# Patient Record
Sex: Male | Born: 1970 | Race: White | Hispanic: No | Marital: Married | Smoking: Never smoker
Health system: Southern US, Community
[De-identification: ages and names within clinical notes are randomized; demographics above are authoritative.]

## PROBLEM LIST (undated history)

## (undated) DIAGNOSIS — I10 Essential (primary) hypertension: Secondary | ICD-10-CM

## (undated) DIAGNOSIS — K219 Gastro-esophageal reflux disease without esophagitis: Secondary | ICD-10-CM

## (undated) HISTORY — PX: HERNIA REPAIR: SHX51

## (undated) HISTORY — DX: Essential (primary) hypertension: I10

---

## 1999-11-13 ENCOUNTER — Encounter: Admission: RE | Admit: 1999-11-13 | Discharge: 2000-02-11 | Payer: Self-pay | Admitting: *Deleted

## 2000-12-11 ENCOUNTER — Encounter: Payer: Self-pay | Admitting: Internal Medicine

## 2000-12-11 ENCOUNTER — Encounter: Admission: RE | Admit: 2000-12-11 | Discharge: 2000-12-11 | Payer: Self-pay | Admitting: Internal Medicine

## 2004-11-23 ENCOUNTER — Emergency Department (HOSPITAL_COMMUNITY): Admission: EM | Admit: 2004-11-23 | Discharge: 2004-11-23 | Payer: Self-pay | Admitting: Emergency Medicine

## 2006-06-28 ENCOUNTER — Encounter: Admission: RE | Admit: 2006-06-28 | Discharge: 2006-06-28 | Payer: Self-pay | Admitting: Orthopaedic Surgery

## 2010-04-19 ENCOUNTER — Other Ambulatory Visit (HOSPITAL_COMMUNITY): Payer: BC Managed Care – PPO

## 2010-04-24 ENCOUNTER — Ambulatory Visit (HOSPITAL_COMMUNITY): Admission: RE | Admit: 2010-04-24 | Payer: BC Managed Care – PPO | Source: Ambulatory Visit | Admitting: General Surgery

## 2010-04-24 ENCOUNTER — Ambulatory Visit
Admission: RE | Admit: 2010-04-24 | Discharge: 2010-04-24 | Disposition: A | Discharge status: Home / Self Care | Payer: BC Managed Care – PPO | Source: Ambulatory Visit | Attending: General Surgery | Admitting: General Surgery

## 2010-04-24 ENCOUNTER — Other Ambulatory Visit: Payer: Self-pay | Admitting: General Surgery

## 2010-04-24 DIAGNOSIS — Z01811 Encounter for preprocedural respiratory examination: Secondary | ICD-10-CM

## 2014-08-30 ENCOUNTER — Other Ambulatory Visit: Payer: Self-pay | Admitting: Internal Medicine

## 2014-08-30 ENCOUNTER — Ambulatory Visit
Admission: RE | Admit: 2014-08-30 | Discharge: 2014-08-30 | Disposition: A | Discharge status: Home / Self Care | Payer: BLUE CROSS/BLUE SHIELD | Source: Ambulatory Visit | Attending: Internal Medicine | Admitting: Internal Medicine

## 2014-08-30 DIAGNOSIS — R05 Cough: Secondary | ICD-10-CM

## 2014-08-30 DIAGNOSIS — R059 Cough, unspecified: Secondary | ICD-10-CM

## 2014-10-25 ENCOUNTER — Ambulatory Visit (INDEPENDENT_AMBULATORY_CARE_PROVIDER_SITE_OTHER): Payer: BLUE CROSS/BLUE SHIELD | Admitting: Internal Medicine

## 2014-10-25 ENCOUNTER — Encounter: Payer: Self-pay | Admitting: Internal Medicine

## 2014-10-25 VITALS — BP 104/70 | HR 92 | Ht 69.0 in | Wt 203.0 lb

## 2014-10-25 DIAGNOSIS — I10 Essential (primary) hypertension: Secondary | ICD-10-CM | POA: Diagnosis not present

## 2014-10-25 DIAGNOSIS — R05 Cough: Secondary | ICD-10-CM

## 2014-10-25 DIAGNOSIS — R058 Other specified cough: Secondary | ICD-10-CM

## 2014-10-25 NOTE — Progress Notes (Signed)
Subjective:    Patient ID: Lee Gilmore, male    DOB: 1971-01-26,    MRN: [id number]  HPI  44 year old white male never smoker and no previous history of any allergy or respiratory issues until April 2016 with the onset of a persistent dry cough referred courtesy of Dr. Eula Listen to the pulmonary clinic 10/26/2014    10/26/2014 1st [facility] office visit/ Lee Gilmore   Chief Complaint  Patient presents with  . Pulmonary Consult    Referred by Dr. Eula Listen. Pt c/o cough x 4 months- non prod and with no specific trigger. He also c/o occ trouble with taking a good, deep breath and sometimes gets dizzy.    onset was abrupt with what he thought was just a typical cold and although all the cold symptoms resolvef the cough did not. He describes it as mostly dry and mostly daytime, rarely disturbing his sleep. It can be so severe he coughs to the point of gagging. However, he denies sign dyspnea and less he is having severe cough fits.  Already tried otc's for cold/cough suppression/ and gerd s benefit.    No obvious other patterns in day to day or daytime variabilty or assoc chronic cough or cp or chest tightness, subjective wheeze overt sinus or hb symptoms. No unusual exp hx or h/o childhood pna/ asthma or knowledge of premature birth.  Sleeping ok without nocturnal  or early am exacerbation  of respiratory  c/o's or need for noct saba. Also denies any obvious fluctuation of symptoms with weather or environmental changes or other aggravating or alleviating factors except as outlined above   Current Medications, Allergies, Complete Past Medical History, Past Surgical History, Family History, and Social History were reviewed in [employer] record.           Review of Systems  Constitutional: Negative for fever, chills, activity change, appetite change and unexpected weight change.  HENT: Negative for congestion, dental problem, postnasal drip, rhinorrhea, sneezing, sore  throat, trouble swallowing and voice change.   Eyes: Negative for visual disturbance.  Respiratory: Positive for cough and shortness of breath. Negative for choking.   Cardiovascular: Negative for chest pain and leg swelling.  Gastrointestinal: Negative for nausea, vomiting and abdominal pain.  Genitourinary: Negative for difficulty urinating.  Musculoskeletal: Negative for arthralgias.  Skin: Negative for rash.  Psychiatric/Behavioral: Negative for behavioral problems and confusion.       Objective:   Physical Exam  amb mod obese pleasant wm with mild voice fatigue  Wt Readings from Last 3 Encounters:  10/25/14 203 lb (92.08 kg)    Vital signs reviewed  HEENT: nl dentition, turbinates, and orophanx. Nl external ear canals without cough reflex   NECK :  without JVD/Nodes/TM/ nl carotid upstrokes bilaterally   LUNGS: no acc muscle use, clear to A and P bilaterally without cough on insp or exp maneuvers   CV:  RRR  no s3 or murmur or increase in P2, no edema   ABD:  soft and nontender with nl excursion in the supine position. No bruits or organomegaly, bowel sounds nl  MS:  warm without deformities, calf tenderness, cyanosis or clubbing  SKIN: warm and dry without lesions    NEURO:  alert, approp, no deficits     I personally reviewed images and agree with radiology impression as follows:  CXR:  08/30/14 Slight hyperinflation with minimal subsegmental atelectasis at LEFT base.         Assessment & Plan:

## 2014-10-26 ENCOUNTER — Encounter: Payer: Self-pay | Admitting: Internal Medicine

## 2014-10-26 DIAGNOSIS — R05 Cough: Secondary | ICD-10-CM | POA: Insufficient documentation

## 2014-10-26 DIAGNOSIS — I10 Essential (primary) hypertension: Secondary | ICD-10-CM | POA: Insufficient documentation

## 2014-10-26 DIAGNOSIS — R058 Other specified cough: Secondary | ICD-10-CM | POA: Insufficient documentation

## 2014-10-26 NOTE — Patient Instructions (Addendum)
The key to effective treatment for your cough is eliminating the non-stop cycle of cough you're stuck in long enough to let your airway heal completely and then see if there is anything still making you cough once you stop the cough suppression, but this should take no more than 5 days to figure out  First take delsym two tsp every 12 hours and supplement if needed with  tramadol 50 mg up to 2 every 4 hours to suppress the urge to cough at all or even clear your throat. Swallowing water or using ice chips/non mint and menthol containing candies (such as lifesavers or sugarless jolly ranchers) are also effective.  You should rest your voice and avoid activities that you know make you cough.  Once you have eliminated the cough for 3 straight days try reducing the tramadol first,  then the delsym as tolerated.    Prednisone 10 mg take  4 each am x 2 days,   2 each am x 2 days,  1 each am x 2 days and stop (this is to eliminate allergies and inflammation from coughing)  Protonix (pantoprazole) Take 30-60 min before first meal of the day and Pepcid 20 mg one bedtime  until cough is completely gone for at least a week without the need for cough suppression  GERD (REFLUX)  is an extremely common cause of respiratory symptoms, many times with no significant heartburn at all.    It can be treated with medication, but also with lifestyle changes including avoidance of late meals, excessive alcohol, smoking cessation, and avoid fatty foods, chocolate, peppermint, colas, red wine, and acidic juices such as orange juice.  NO MINT OR MENTHOL PRODUCTS SO NO COUGH DROPS  USE HARD CANDY INSTEAD (jolley ranchers or Stover's or Lifesavers (all available in sugarless versions) NO OIL BASED VITAMINS - use powdered substitutes.  Follow up is as needed/ consider trial off losartan next

## 2014-10-26 NOTE — Assessment & Plan Note (Signed)
The most common causes of chronic cough in immunocompetent adults include the following: upper airway cough syndrome (UACS), previously referred to as postnasal drip syndrome (PNDS), which is caused by variety of rhinosinus conditions; (2) asthma; (3) GERD; (4) chronic bronchitis from cigarette smoking or other inhaled environmental irritants; (5) nonasthmatic eosinophilic bronchitis; and (6) bronchiectasis.   These conditions, singly or in combination, have accounted for up to 94% of the causes of chronic cough in prospective studies.   Other conditions have constituted no >6% of the causes in prospective studies These have included bronchogenic carcinoma, chronic interstitial pneumonia, sarcoidosis, left ventricular failure, ACEI-induced cough, and aspiration from a condition associated with pharyngeal dysfunction.    Chronic cough is often simultaneously caused by more than one condition. A single cause has been found from 38 to 82% of the time, multiple causes from 18 to 62%. Multiply caused cough has been the result of three diseases up to 42% of the time.       Based on hx and exam, this is most likely:  Classic Upper airway cough syndrome, so named because it's frequently impossible to sort out how much is  CR/sinusitis with freq throat clearing (which can be related to primary GERD)   vs  causing  secondary (" extra esophageal")  GERD from wide swings in gastric pressure that occur with throat clearing, often  promoting self use of mint and menthol lozenges that reduce the lower esophageal sphincter tone and exacerbate the problem further in a cyclical fashion.   These are the same pts (now being labeled as having "irritable larynx syndrome" by some cough centers) who not infrequently have a history of having failed to tolerate ace inhibitors,  dry powder inhalers or biphosphonates or report having atypical reflux symptoms that don't respond to standard doses of PPI , and are easily confused as  having aecopd or asthma flares by even experienced allergists/ pulmonologists.   The first step is to maximize acid suppression and eliminate cyclical coughing then regroup if the cough persists.  I had an extended discussion with the patient and wife reviewing all relevant studies completed to date and  lasting 35 min  1) Explained: The standardized cough guidelines published in Chest by [place] in 2006 are still the best available and consist of a multiple step process (up to 12!) , not a single office visit,  and are intended  to address this problem logically,  with an alogrithm dependent on response to empiric treatment at  each progressive step  to determine a specific diagnosis with  minimal addtional testing needed. Therefore if adherence is an issue or can't be accurately verified,  it's very unlikely the standard evaluation and treatment will be successful here.    Furthermore, response to therapy (other than acute cough suppression, which should only be used short term with avoidance of narcotic containing cough syrups if possible), can be a gradual process for which the patient may perceive immediate benefit.  Unlike going to an eye doctor where the best perscription is almost always the first one and is immediately effective, this is almost never the case in the management of chronic cough syndromes. Therefore the patient needs to commit up front to consistently adhere to recommendations  for up to 6 weeks of therapy directed at the likely underlying problem(s) before the response can be reasonably evaluated.     2) Each maintenance medication was reviewed in detail including most importantly the difference between maintenance and prns and  under what circumstances the prns are to be triggered using an action plan format that is not reflected in the computer generated alphabetically organized AVS.    Please see instructions for details which were reviewed in writing and the patient  given a copy highlighting the part that I personally wrote and discussed at today's ov.   See instructions for specific recommendations which were reviewed directly with the patient who was given a copy with highlighter outlining the key components.

## 2014-10-26 NOTE — Assessment & Plan Note (Signed)
For reasons that may related to vascular permability and nitric oxide pathways but not elevated  bradykinin levels (as seen with  ACEi use) losartan in the generic form has been reported now from mulitple sources  to cause a similar pattern of non-specific  upper airway symptoms as seen with acei.   This has not been reported with exposure to the other ARB's to date, so if cough persists then I would try either generic diovan or avapro if ARB needed or use an alternative class altogether.  See:  [facility] All[facility] Asthma Immunol  2008: 101: p 495-499

## 2014-11-15 ENCOUNTER — Telehealth: Payer: Self-pay | Admitting: Internal Medicine

## 2014-11-15 DIAGNOSIS — I10 Essential (primary) hypertension: Secondary | ICD-10-CM

## 2014-11-15 MED ORDER — VALSARTAN 80 MG PO TABS: 80.0000 mg | ORAL_TABLET | Freq: Every day | ORAL | Status: DC

## 2014-11-15 MED ORDER — ACETAMINOPHEN-CODEINE #3 300-30 MG PO TABS: ORAL_TABLET | ORAL | Status: AC

## 2014-11-15 NOTE — Telephone Encounter (Signed)
Patient's wife calling.  She says that patient took prednisone, Delsym, Nexium, Pepcid.  Patient took the Ultram and it made him "drunk" and he cannot take it.  Cough is some better, but still coughing a lot. Still taking Nexium and Pepcid.Lee Gilmore with Prednisone.  His chest feels :"irritated" from the cough since he is coughing so hard.     No Known Allergies

## 2014-11-15 NOTE — Telephone Encounter (Signed)
Stop cozar  Start diovan 80 mg one daily  All cough meds stronger than delsym can cause wooziness so best option is tylenol # 3 one half every 4 hours and if tolerates and still coughing then a whole every 4 hours to replace the ultram in his instructions: give #40 and make sure he has ov by the end of next week

## 2014-11-15 NOTE — Telephone Encounter (Signed)
Patients wife notified. Will call back to schedule appointment, patient is currently out of town Rx sent to pharmacy. Nothing further needed.

## 2014-11-27 ENCOUNTER — Ambulatory Visit (INDEPENDENT_AMBULATORY_CARE_PROVIDER_SITE_OTHER)
Admission: RE | Admit: 2014-11-27 | Discharge: 2014-11-27 | Disposition: A | Discharge status: Home / Self Care | Payer: BLUE CROSS/BLUE SHIELD | Source: Ambulatory Visit | Attending: Internal Medicine | Admitting: Internal Medicine

## 2014-11-27 ENCOUNTER — Ambulatory Visit (INDEPENDENT_AMBULATORY_CARE_PROVIDER_SITE_OTHER): Payer: BLUE CROSS/BLUE SHIELD | Admitting: Internal Medicine

## 2014-11-27 ENCOUNTER — Encounter: Payer: Self-pay | Admitting: Internal Medicine

## 2014-11-27 ENCOUNTER — Other Ambulatory Visit (INDEPENDENT_AMBULATORY_CARE_PROVIDER_SITE_OTHER): Payer: BLUE CROSS/BLUE SHIELD

## 2014-11-27 VITALS — BP 138/86 | HR 78 | Ht 70.0 in | Wt 205.0 lb

## 2014-11-27 DIAGNOSIS — R05 Cough: Secondary | ICD-10-CM | POA: Diagnosis not present

## 2014-11-27 DIAGNOSIS — R058 Other specified cough: Secondary | ICD-10-CM

## 2014-11-27 DIAGNOSIS — I10 Essential (primary) hypertension: Secondary | ICD-10-CM

## 2014-11-27 LAB — CBC WITH DIFFERENTIAL/PLATELET
BASOS ABS: 0 10*3/uL (ref 0.0–0.1)
Basophils Relative: 0.7 % (ref 0.0–3.0)
EOS ABS: 0 10*3/uL (ref 0.0–0.7)
Eosinophils Relative: 0.6 % (ref 0.0–5.0)
HCT: 47.7 % (ref 39.0–52.0)
Hemoglobin: 16.2 g/dL (ref 13.0–17.0)
LYMPHS PCT: 23.9 % (ref 12.0–46.0)
Lymphs Abs: 1.6 10*3/uL (ref 0.7–4.0)
MCHC: 34 g/dL (ref 30.0–36.0)
MCV: 87.3 fl (ref 78.0–100.0)
Monocytes Absolute: 0.4 10*3/uL (ref 0.1–1.0)
Monocytes Relative: 6.4 % (ref 3.0–12.0)
NEUTROS ABS: 4.5 10*3/uL (ref 1.4–7.7)
Neutrophils Relative %: 68.4 % (ref 43.0–77.0)
Platelets: 250 10*3/uL (ref 150.0–400.0)
RBC: 5.46 Mil/uL (ref 4.22–5.81)
RDW: 13.4 % (ref 11.5–15.5)
WBC: 6.5 10*3/uL (ref 4.0–10.5)

## 2014-11-27 MED ORDER — VALSARTAN-HYDROCHLOROTHIAZIDE 160-12.5 MG PO TABS: 1.0000 | ORAL_TABLET | Freq: Every day | ORAL | Status: DC

## 2014-11-27 NOTE — Patient Instructions (Addendum)
The key to effective treatment for your cough is eliminating the non-stop cycle of cough you're stuck in long enough to let your airway heal completely and then see if there is anything still making you cough once you stop the cough suppression, but this should take no more than 5 days to figure out  First take delsym two tsp every 12 hours and supplement if needed with tylenol #3  up to 2 every 4 hours to suppress the urge to cough at all or even clear your throat. Swallowing water or using ice chips/non mint and menthol containing candies (such as lifesavers or sugarless jolly ranchers) are also effective.  You should rest your voice and avoid activities that you know make you cough.  Once you have eliminated the cough for 3 straight days try reducing the tylenol #3  first,  then the delsym as tolerated.     Protonix (pantoprazole) Take 30-60 min before first meal of the day and Pepcid 20 mg one bedtime  until cough is completely gone for at least a week without the need for cough suppression  For drainage / throat tickle try take CHLORPHENIRAMINE  4 mg - take one every 4 hours as needed - available over the counter- may cause drowsiness so start with just a bedtime dose or two and see how you tolerate it before trying in daytime    Take another diovan asap and 2 daily until gone then start diovan 160-12.5 one daily   Please remember to go to the lab and x-ray department downstairs for your tests - we will call you with the results when they are available.     Please schedule a follow up office visit in 2 weeks, sooner if needed  - ok to cancel if doing great

## 2014-11-27 NOTE — Progress Notes (Signed)
Subjective:    Patient ID: Lee Gilmore, male    DOB: [birth year]    MRN: [id number]   Brief patient profile:  44 year old white male never smoker and no previous history of any allergy or respiratory issues until April 2016 with the onset of a persistent dry cough referred courtesy of Dr. Eula Listen to the pulmonary clinic 10/26/2014    10/26/2014 1st [facility] office visit/ Wert   Chief Complaint  Patient presents with  . Pulmonary Consult    Referred by Dr. Eula Listen. Pt c/o cough x 4 months- non prod and with no specific trigger. He also c/o occ trouble with taking a good, deep breath and sometimes gets dizzy.    onset was abrupt with what he thought was just a typical cold and although all the cold symptoms resolvef the cough did not. He describes it as mostly dry and mostly daytime, rarely disturbing his sleep. It can be so severe he coughs to the point of gagging. However, he denies sign dyspnea and less he is having severe cough fits.  Already tried otc's for cold/cough suppression/ and gerd s benefit.  Rec  First take delsym two tsp every 12 hours and supplement if needed with  tramadol 50 mg up to 2 every 4 hours to suppress the urge to cough at all or even clear your throat. Swallowing water or using ice chips/non mint and menthol containing candies (such as lifesavers or sugarless jolly ranchers) are also effective.  You should rest your voice and avoid activities that you know make you cough. Once you have eliminated the cough for 3 straight days try reducing the tramadol first,  then the delsym as tolerated.   Prednisone 10 mg take  4 each am x 2 days,   2 each am x 2 days,  1 each am x 2 days and stop (this is to eliminate allergies and inflammation from coughing) Protonix (pantoprazole) Take 30-60 min before first meal of the day and Pepcid 20 mg one bedtime  until cough is completely gone for at least a week without the need for cough suppression GERD  Diet  Follow up is as  needed/   trial off losartan next > 11/20/14   11/27/2014  F/u [facility] office visit/ Wert  Re cough x April  2016  Chief Complaint  Patient presents with  . Follow-up    Still havig some cough but not as bad. still dry.   cough is mostly daytime mostly dry no longer gagging and overall 50% better though could not tol tramadol, has taken codeine before s trouble   No obvious day to day or daytime variability or assoc sob  or cp or chest tightness, subjective wheeze or overt sinus or hb symptoms. No unusual exp hx or h/o childhood pna/ asthma or knowledge of premature birth.  Sleeping ok without nocturnal  or early am exacerbation  of respiratory  c/o's or need for noct saba. Also denies any obvious fluctuation of symptoms with weather or environmental changes or other aggravating or alleviating factors except as outlined above   Current Medications, Allergies, Complete Past Medical History, Past Surgical History, Family History, and Social History were reviewed in [employer] record.  ROS  The following are not active complaints unless bolded sore throat, dysphagia, dental problems, itching, sneezing,  nasal congestion or excess/ purulent secretions, ear ache,   fever, chills, sweats, unintended wt loss, classically pleuritic or exertional cp, hemoptysis,  orthopnea pnd  or leg swelling, presyncope, palpitations, abdominal pain, anorexia, nausea, vomiting, diarrhea  or change in bowel or bladder habits, change in stools or urine, dysuria,hematuria,  rash, arthralgias, visual complaints, headache, numbness, weakness or ataxia or problems with walking or coordination,  change in mood/affect or memory.                           Objective:   Physical Exam  amb mod obese pleasant wm      Wt Readings from Last 3 Encounters:  11/27/14 205 lb (92.987 kg)  10/25/14 203 lb (92.08 kg)    Vital signs reviewed   HEENT: nl dentition, turbinates, and orophanx. Nl  external ear canals without cough reflex   NECK :  without JVD/Nodes/TM/ nl carotid upstrokes bilaterally   LUNGS: no acc muscle use, clear to A and P bilaterally without cough on insp or exp maneuvers   CV:  RRR  no s3 or murmur or increase in P2, no edema   ABD:  soft and nontender with nl excursion in the supine position. No bruits or organomegaly, bowel sounds nl  MS:  warm without deformities, calf tenderness, cyanosis or clubbing  SKIN: warm and dry without lesions    NEURO:  alert, approp, no deficits     I personally reviewed images and agree with radiology impression as follows:  CXR:    11/27/2014 Trachea is midline. Heart size normal. Minimal scarring in the lingula. Calcified granuloma in the right upper lobe. Lungs are somewhat hyperexpanded but otherwise clear. No pleural fluid.  Labs ordered: allergy profile 11/27/2014 see a/p       Assessment & Plan:

## 2014-11-28 LAB — ALLERGY FULL PROFILE
Allergen,Goose feathers, e70: <0.1 kU/L
Alternaria Alternata: <0.1 kU/L
Bahia Grass: <0.1 kU/L
Bermuda Grass: <0.1 kU/L
Box Elder IgE: <0.1 kU/L
Cat Dander: <0.1 kU/L
Common Ragweed: <0.1 kU/L
Curvularia lunata: <0.1 kU/L
Dog Dander: <0.1 kU/L
Elm IgE: <0.1 kU/L
Fescue: <0.1 kU/L
G005 Rye, Perennial: <0.1 kU/L
G009 Red Top: <0.1 kU/L
Goldenrod: <0.1 kU/L
House Dust Hollister: <0.1 kU/L
IGE (IMMUNOGLOBULIN E), SERUM: 22 kU/L (ref <115)
Oak: <0.1 kU/L
Plantain: <0.1 kU/L
Sycamore Tree: <0.1 kU/L
Timothy Grass: <0.1 kU/L

## 2014-11-28 NOTE — Progress Notes (Signed)
Quick Note:  Spoke with pt and notified of results per Dr. Wert. Pt verbalized understanding and denied any questions.  ______ 

## 2014-11-29 ENCOUNTER — Encounter: Payer: Self-pay | Admitting: Internal Medicine

## 2014-11-29 NOTE — Assessment & Plan Note (Addendum)
11/15/2014 changed cozar to diovan due to cough   Not Adequate control on present rx, reviewed > rec take 2 diovan daily until used up then start  diovan 160-12.5 one daily

## 2014-11-29 NOTE — Assessment & Plan Note (Signed)
-   allergy profile 11/27/14   IgE 22, neg RAST,  Eos 0  Still strongly  favor  Classic Upper airway cough syndrome, so named because it's frequently impossible to sort out how much is  CR/sinusitis with freq throat clearing (which can be related to primary GERD)   vs  causing  secondary (" extra esophageal")  GERD from wide swings in gastric pressure that occur with throat clearing, often  promoting self use of mint and menthol lozenges that reduce the lower esophageal sphincter tone and exacerbate the problem further in a cyclical fashion.   These are the same pts (now being labeled as having "irritable larynx syndrome" by some cough centers) who not infrequently have a history of having failed to tolerate ace inhibitors,  dry powder inhalers or biphosphonates or report having atypical reflux symptoms that don't respond to standard doses of PPI , and are easily confused as having aecopd or asthma flares by even experienced allergists/ pulmonologists.   rec max gerd rx/ elimination of cyclical cough then MCT vs gabapentin trial next   I had an extended discussion with the patient reviewing all relevant studies completed to date and  lasting 15 to 20 minutes of a 25 minute visit    Each maintenance medication was reviewed in detail including most importantly the difference between maintenance and prns and under what circumstances the prns are to be triggered using an action plan format that is not reflected in the computer generated alphabetically organized AVS.    Please see instructions for details which were reviewed in writing and the patient given a copy highlighting the part that I personally wrote and discussed at today's ov.

## 2014-12-11 ENCOUNTER — Ambulatory Visit: Payer: BLUE CROSS/BLUE SHIELD | Admitting: Internal Medicine

## 2014-12-15 ENCOUNTER — Telehealth: Payer: Self-pay | Admitting: Internal Medicine

## 2014-12-15 NOTE — Telephone Encounter (Signed)
Spoke with spouse. Cough has improved and has occas cough. appt cancelled. Nothing further needed

## 2014-12-18 ENCOUNTER — Ambulatory Visit: Payer: BLUE CROSS/BLUE SHIELD | Admitting: Internal Medicine

## 2015-04-17 ENCOUNTER — Encounter: Payer: Self-pay | Admitting: Podiatry

## 2015-04-17 ENCOUNTER — Ambulatory Visit (INDEPENDENT_AMBULATORY_CARE_PROVIDER_SITE_OTHER): Payer: BLUE CROSS/BLUE SHIELD

## 2015-04-17 ENCOUNTER — Ambulatory Visit (INDEPENDENT_AMBULATORY_CARE_PROVIDER_SITE_OTHER): Payer: BLUE CROSS/BLUE SHIELD | Admitting: Podiatry

## 2015-04-17 VITALS — BP 125/80 | HR 78 | Resp 12

## 2015-04-17 DIAGNOSIS — M722 Plantar fascial fibromatosis: Secondary | ICD-10-CM

## 2015-04-17 MED ORDER — METHYLPREDNISOLONE 4 MG PO TBPK
ORAL_TABLET | ORAL | Status: DC
Start: 2015-04-17 — End: 2015-05-14

## 2015-04-17 MED ORDER — MELOXICAM 15 MG PO TABS: 15.0000 mg | ORAL_TABLET | Freq: Every day | ORAL | Status: DC

## 2015-04-17 NOTE — Progress Notes (Signed)
   Subjective:    Patient ID: Lee Gilmore, male    DOB: 11-Aug-1970, 45 y.o.   MRN: [id number]  HPI : He presents today with approximately one-month duration of pain to the bilateral heels. He states the morning seemed to be worse. He denies any treatment.   Review of Systems  Musculoskeletal: Positive for gait problem.       Objective:   Physical Exam:vital signs are stable alert and oriented 3 distress. Pulses are strongly palpable bilateral. Neurologic sensorium is intact per Semmes-Weinstein monofilament. Deep tendon reflexes are intact. Muscle strength is normal bilateral. Orthopedic evaluation demonstrates pain on palpation medial calcaneal tubercle the bilateral heel no pain on medial and lateral compression of the calcaneus. Radiographs do demonstrate a long plantar distally oriented calcaneal heel spur with soft tissue increase in density at the plantar fascial calcaneal insertion site left greater than right. Cutaneous evaluation demonstrates supple well-hydrated cutis no erythema edema cellulitis drainage or odor.          Assessment & Plan:  Plantar fasciitis bilateral.  Plan: Started him on a Medrol Dosepakto followed by meloxicam. Injected the bilateral heels today with Kenalog and local anesthetic placed him in a plantar fascial brace bilaterally and dispensed a plantar fascia night splint. We discussed appropriate shoe gear stretching exercises ice therapy and shoe gear modifications and may consider orthotics next visit. I will follow up with him in 1 month

## 2015-04-17 NOTE — Patient Instructions (Signed)

## 2015-04-18 ENCOUNTER — Other Ambulatory Visit: Payer: Self-pay | Admitting: Internal Medicine

## 2015-04-18 DIAGNOSIS — K219 Gastro-esophageal reflux disease without esophagitis: Secondary | ICD-10-CM

## 2015-04-20 ENCOUNTER — Other Ambulatory Visit: Payer: BLUE CROSS/BLUE SHIELD

## 2015-04-23 ENCOUNTER — Ambulatory Visit
Admission: RE | Admit: 2015-04-23 | Discharge: 2015-04-23 | Disposition: A | Discharge status: Home / Self Care | Payer: BLUE CROSS/BLUE SHIELD | Source: Ambulatory Visit | Attending: Internal Medicine | Admitting: Internal Medicine

## 2015-04-23 DIAGNOSIS — K219 Gastro-esophageal reflux disease without esophagitis: Secondary | ICD-10-CM

## 2015-04-24 ENCOUNTER — Other Ambulatory Visit: Payer: BLUE CROSS/BLUE SHIELD

## 2015-05-09 ENCOUNTER — Other Ambulatory Visit: Payer: Self-pay | Admitting: Gastroenterology

## 2015-05-14 ENCOUNTER — Encounter: Payer: Self-pay | Admitting: Podiatry

## 2015-05-14 ENCOUNTER — Other Ambulatory Visit: Payer: Self-pay | Admitting: Gastroenterology

## 2015-05-14 ENCOUNTER — Ambulatory Visit (INDEPENDENT_AMBULATORY_CARE_PROVIDER_SITE_OTHER): Payer: BLUE CROSS/BLUE SHIELD | Admitting: Podiatry

## 2015-05-14 VITALS — BP 133/87 | HR 71 | Resp 16

## 2015-05-14 DIAGNOSIS — M722 Plantar fascial fibromatosis: Secondary | ICD-10-CM | POA: Diagnosis not present

## 2015-05-14 MED ORDER — DICLOFENAC SODIUM 1 % TD GEL: 4.0000 g | Freq: Four times a day (QID) | TRANSDERMAL | Status: AC

## 2015-05-14 NOTE — Progress Notes (Signed)
He presents today stating that his plantar fasciitis both feet as proximally 50% improved. He states that he is unable to wear the night splint. He is also unable to take his meloxicam secondary to reflux.  Objective: Vital signs are stable alert and oriented 3. Pulses are palpable. Neurologic sensorium is intact. He has pain on palpation medial calcaneal tubercles bilateral much decreased from previously noted.  Assessment: Plantar fasciitis bilateral.  Plan: Reinjected the bilateral heels today wrote a prescription for diclofenac gel to be applied 4 times a day. Follow up with him in 4 weeks. Consider orthotics.

## 2015-05-15 ENCOUNTER — Ambulatory Visit: Payer: BLUE CROSS/BLUE SHIELD | Admitting: Podiatry

## 2015-05-15 ENCOUNTER — Telehealth: Payer: Self-pay | Admitting: *Deleted

## 2015-05-15 NOTE — Telephone Encounter (Addendum)
Pt's wife, Marylu LundJanet states pt's medication may have been called into the wrong pharmacy.  I informed Marylu LundJanet, that the pharmacy did receive the rx, but the Voltaren gel was going through the prior authorizing stages with the pharmacy and if not authorized would come to me to be appealed.  05/18/2015-Pt's wife, Marylu LundJanet called asked the status of pt's Voltaren gel approval.  I

## 2015-05-23 ENCOUNTER — Encounter (HOSPITAL_COMMUNITY): Payer: Self-pay | Admitting: *Deleted

## 2015-05-24 ENCOUNTER — Encounter (HOSPITAL_COMMUNITY): Payer: Self-pay | Admitting: Anesthesiology

## 2015-05-24 NOTE — Anesthesia Preprocedure Evaluation (Deleted)
Anesthesia Evaluation  Patient identified by MRN, date of birth, ID band Patient awake    Reviewed: Allergy & Precautions, NPO status , Patient's Chart, lab work & pertinent test results  Airway        Dental   Pulmonary neg pulmonary ROS,           Cardiovascular hypertension, Pt. on medications negative cardio ROS       Neuro/Psych negative neurological ROS  negative psych ROS   GI/Hepatic negative GI ROS, Neg liver ROS, GERD  Medicated,  Endo/Other  negative endocrine ROS  Renal/GU negative Renal ROS  negative genitourinary   Musculoskeletal negative musculoskeletal ROS (+)   Abdominal   Peds negative pediatric ROS (+)  Hematology negative hematology ROS (+)   Anesthesia Other Findings   Reproductive/Obstetrics negative OB ROS                             Anesthesia Physical Anesthesia Plan  ASA: II  Anesthesia Plan: MAC   Post-op Pain Management:    Induction: Intravenous  Airway Management Planned: Nasal Cannula  Additional Equipment:   Intra-op Plan:   Post-operative Plan:   Informed Consent: I have reviewed the patients History and Physical, chart, labs and discussed the procedure including the risks, benefits and alternatives for the proposed anesthesia with the patient or authorized representative who has indicated his/her understanding and acceptance.     Plan Discussed with:   Anesthesia Plan Comments:         Anesthesia Quick Evaluation

## 2015-05-28 ENCOUNTER — Ambulatory Visit (HOSPITAL_COMMUNITY)
Admission: RE | Admit: 2015-05-28 | Payer: BLUE CROSS/BLUE SHIELD | Source: Ambulatory Visit | Admitting: Gastroenterology

## 2015-05-28 HISTORY — DX: Gastro-esophageal reflux disease without esophagitis: K21.9

## 2015-05-28 SURGERY — EGD (ESOPHAGOGASTRODUODENOSCOPY)
Anesthesia: Monitor Anesthesia Care

## 2015-05-29 ENCOUNTER — Other Ambulatory Visit: Payer: Self-pay | Admitting: Gastroenterology

## 2015-05-30 ENCOUNTER — Encounter (HOSPITAL_COMMUNITY): Payer: Self-pay | Admitting: *Deleted

## 2015-06-04 ENCOUNTER — Ambulatory Visit (HOSPITAL_COMMUNITY)
Admission: RE | Admit: 2015-06-04 | Payer: BLUE CROSS/BLUE SHIELD | Source: Ambulatory Visit | Admitting: Gastroenterology

## 2015-06-04 SURGERY — ESOPHAGOGASTRODUODENOSCOPY (EGD) WITH PROPOFOL
Anesthesia: Monitor Anesthesia Care

## 2015-06-07 ENCOUNTER — Other Ambulatory Visit: Payer: Self-pay | Admitting: Gastroenterology

## 2015-06-18 ENCOUNTER — Encounter: Payer: Self-pay | Admitting: Podiatry

## 2015-06-18 ENCOUNTER — Ambulatory Visit (INDEPENDENT_AMBULATORY_CARE_PROVIDER_SITE_OTHER): Payer: BLUE CROSS/BLUE SHIELD | Admitting: Podiatry

## 2015-06-18 VITALS — BP 130/95 | HR 74 | Resp 16

## 2015-06-18 DIAGNOSIS — M722 Plantar fascial fibromatosis: Secondary | ICD-10-CM | POA: Diagnosis not present

## 2015-06-18 NOTE — Progress Notes (Signed)
He presents today for follow-up of his plantar fasciitis bilaterally. He states that he is 100% improved. He has discontinued the use of his tennis shoes as well as the use of his Voltaren gel.  Objective: Vital signs are stable he is alert and oriented 3. Pulses are palpable. No reproducible pain on palpation.  Assessment: Resolving plantar fasciitis bilateral.  Plan: Follow up with me on an as-needed basis.

## 2015-06-19 ENCOUNTER — Ambulatory Visit (HOSPITAL_COMMUNITY): Payer: BLUE CROSS/BLUE SHIELD | Admitting: Certified Registered Nurse Anesthetist

## 2015-06-19 ENCOUNTER — Ambulatory Visit (HOSPITAL_COMMUNITY)
Admission: RE | Admit: 2015-06-19 | Discharge: 2015-06-19 | Disposition: A | Discharge status: Home / Self Care | Payer: BLUE CROSS/BLUE SHIELD | Source: Ambulatory Visit | Attending: Gastroenterology | Admitting: Gastroenterology

## 2015-06-19 ENCOUNTER — Encounter (HOSPITAL_COMMUNITY)
Admission: RE | Disposition: A | Discharge status: Home / Self Care | Payer: Self-pay | Source: Ambulatory Visit | Attending: Gastroenterology

## 2015-06-19 ENCOUNTER — Encounter (HOSPITAL_COMMUNITY): Payer: Self-pay | Admitting: *Deleted

## 2015-06-19 DIAGNOSIS — K319 Disease of stomach and duodenum, unspecified: Secondary | ICD-10-CM | POA: Diagnosis not present

## 2015-06-19 DIAGNOSIS — K2970 Gastritis, unspecified, without bleeding: Secondary | ICD-10-CM | POA: Diagnosis not present

## 2015-06-19 DIAGNOSIS — I10 Essential (primary) hypertension: Secondary | ICD-10-CM | POA: Diagnosis not present

## 2015-06-19 DIAGNOSIS — K210 Gastro-esophageal reflux disease with esophagitis: Secondary | ICD-10-CM | POA: Insufficient documentation

## 2015-06-19 DIAGNOSIS — Z800 Family history of malignant neoplasm of digestive organs: Secondary | ICD-10-CM | POA: Insufficient documentation

## 2015-06-19 DIAGNOSIS — K449 Diaphragmatic hernia without obstruction or gangrene: Secondary | ICD-10-CM | POA: Insufficient documentation

## 2015-06-19 DIAGNOSIS — Z86010 Personal history of colonic polyps: Secondary | ICD-10-CM | POA: Diagnosis not present

## 2015-06-19 DIAGNOSIS — R12 Heartburn: Secondary | ICD-10-CM | POA: Insufficient documentation

## 2015-06-19 HISTORY — PX: ESOPHAGOGASTRODUODENOSCOPY (EGD) WITH PROPOFOL: SHX5813

## 2015-06-19 SURGERY — ESOPHAGOGASTRODUODENOSCOPY (EGD) WITH PROPOFOL
Anesthesia: Monitor Anesthesia Care

## 2015-06-19 MED ORDER — ONDANSETRON HCL 4 MG/2ML IJ SOLN
INTRAMUSCULAR | Status: DC | PRN
  Administered 2015-06-19: 4 mg via INTRAVENOUS

## 2015-06-19 MED ORDER — EPHEDRINE SULFATE 50 MG/ML IJ SOLN
INTRAMUSCULAR | Status: AC
  Filled 2015-06-19: qty 1

## 2015-06-19 MED ORDER — PROPOFOL 10 MG/ML IV BOLUS
INTRAVENOUS | Status: AC
  Filled 2015-06-19: qty 40

## 2015-06-19 MED ORDER — SODIUM CHLORIDE 0.9 % IJ SOLN
INTRAMUSCULAR | Status: AC
  Filled 2015-06-19: qty 10

## 2015-06-19 MED ORDER — PROPOFOL 500 MG/50ML IV EMUL
INTRAVENOUS | Status: DC | PRN
  Administered 2015-06-19: 175 ug/kg/min via INTRAVENOUS

## 2015-06-19 MED ORDER — LACTATED RINGERS IV SOLN
INTRAVENOUS | Status: DC
  Administered 2015-06-19: 1000 mL via INTRAVENOUS
  Administered 2015-06-19: 07:00:00 via INTRAVENOUS

## 2015-06-19 MED ORDER — SODIUM CHLORIDE 0.9 % IV SOLN: INTRAVENOUS | Status: DC

## 2015-06-19 MED ORDER — LIDOCAINE HCL (CARDIAC) 20 MG/ML IV SOLN
INTRAVENOUS | Status: DC | PRN
  Administered 2015-06-19: 60 mg via INTRATRACHEAL

## 2015-06-19 MED ORDER — ONDANSETRON HCL 4 MG/2ML IJ SOLN
INTRAMUSCULAR | Status: AC
  Filled 2015-06-19: qty 2

## 2015-06-19 MED ORDER — BUTAMBEN-TETRACAINE-BENZOCAINE 2-2-14 % EX AERO
INHALATION_SPRAY | CUTANEOUS | Status: DC | PRN
  Administered 2015-06-19: 1 via TOPICAL

## 2015-06-19 MED ORDER — LIDOCAINE HCL (CARDIAC) 20 MG/ML IV SOLN
INTRAVENOUS | Status: AC
  Filled 2015-06-19: qty 5

## 2015-06-19 SURGICAL SUPPLY — 15 items

## 2015-06-19 NOTE — Op Note (Signed)
[facility] Patient Name: Lee Gilmore Procedure Date: 06/19/2015 MRN: [id number] Attending MD: Charolett Bumpers , MD Date of Birth: Dec 10, 1970 CSN:  Age: 45 Admit Type: Outpatient Procedure:                Upper GI endoscopy Indications:              Heartburn Providers:                Charolett Bumpers, MD, Priscella Mann, RN, Arlee Muslim, Technician Referring MD:              Medicines:                Propofol per Anesthesia Complications:            No immediate complications. Estimated Blood Loss:     Estimated blood loss: none. Procedure:                Pre-Anesthesia Assessment:                           - Prior to the procedure, a History and Physical                            was performed, and patient medications and                            allergies were reviewed. The patient's tolerance of                            previous anesthesia was also reviewed. The risks                            and benefits of the procedure and the sedation                            options and risks were discussed with the patient.                            All questions were answered, and informed consent                            was obtained. Prior Anticoagulants: The patient has                            taken no previous anticoagulant or antiplatelet                            agents. ASA Grade Assessment: II - A patient with                            mild systemic disease. After reviewing the risks  and benefits, the patient was deemed in                            satisfactory condition to undergo the procedure.                           After obtaining informed consent, the endoscope was                            passed under direct vision. Throughout the                            procedure, the patient's blood pressure, pulse, and                            oxygen saturations were monitored  continuously. The                            EG-[id number] ([id number] scope was introduced through the                            mouth, and advanced to the second part of duodenum.                            The upper GI endoscopy was accomplished without                            difficulty. The patient tolerated the procedure                            well. Scope In: Scope Out: Findings:      The Z-line was irregular and was found 40 cm from the incisors. Biopsies       were taken with a cold forceps for histology.      The examined esophagus was normal.      Multiple, 3 mm non-bleeding erosions were found in the prepyloric region       of the stomach. There were no stigmata of recent bleeding. Biopsies were       taken with a cold forceps for histology.      The examined duodenum was normal. Impression:               - Z-line irregular, 40 cm from the incisors.                            Biopsied.                           - Normal esophagus.                           - Non-bleeding erosive gastropathy. Biopsied.                           - Normal examined duodenum. Moderate Sedation:      N/A- Per Anesthesia Care Recommendation:           -  Patient has a contact number available for                            emergencies. The signs and symptoms of potential                            delayed complications were discussed with the                            patient. Return to normal activities tomorrow.                            Written discharge instructions were provided to the                            patient.                           - Await pathology results.                           - Resume previous diet.                           - Continue present medications. Procedure Code(s):        --- Professional ---                           [phone], Esophagogastroduodenoscopy, flexible,                            transoral; with biopsy, single or multiple Diagnosis Code(s):         --- Professional ---                           K31.89, Other diseases of stomach and duodenum                           R12, Heartburn CPT copyright 2016 [employer]. All rights reserved. The codes documented in this report are preliminary and upon coder review may  be revised to meet current compliance requirements. Danise Edge, MD Charolett Bumpers, MD 06/19/2015 7:50:10 AM This report has been signed electronically. Number of Addenda: 0

## 2015-06-19 NOTE — Discharge Instructions (Signed)
Esophagogastroduodenoscopy °Esophagogastroduodenoscopy (EGD) is a procedure that is used to examine the lining of the esophagus, stomach, and first part of the small intestine (duodenum). A long, flexible, lighted tube with a camera attached (endoscope) is inserted down the throat to view these organs. This procedure is done to detect problems or abnormalities, such as inflammation, bleeding, ulcers, or growths, in order to treat them. The procedure lasts 5-20 minutes. It is usually an outpatient procedure, but it may need to be performed in a hospital in emergency cases. °LET YOUR HEALTH CARE PROVIDER KNOW ABOUT: °· Any allergies you have. °· All medicines you are taking, including vitamins, herbs, eye drops, creams, and over-the-counter medicines. °· Previous problems you or members of your family have had with the use of anesthetics. °· Any blood disorders you have. °· Previous surgeries you have had. °· Medical conditions you have. °RISKS AND COMPLICATIONS °Generally, this is a safe procedure. However, problems can occur and include: °· Infection. °· Bleeding. °· Tearing (perforation) of the esophagus, stomach, or duodenum. °· Difficulty breathing or not being able to breathe. °· Excessive sweating. °· Spasms of the larynx. °· Slowed heartbeat. °· Low blood pressure. °BEFORE THE PROCEDURE °· Do not eat or drink anything after midnight on the night before the procedure or as directed by your health care provider. °· Do not take your regular medicines before the procedure if your health care provider asks you not to. Ask your health care provider about changing or stopping those medicines. °· If you wear dentures, be prepared to remove them before the procedure. °· Arrange for someone to drive you home after the procedure. °PROCEDURE °· A numbing medicine (local anesthetic) may be sprayed in your throat for comfort and to stop you from gagging or coughing. °· You will have an IV tube inserted in a vein in your  hand or arm. You will receive medicines and fluids through this tube. °· You will be given a medicine to relax you (sedative). °· A pain reliever will be given through the IV tube. °· A mouth guard may be placed in your mouth to protect your teeth and to keep you from biting on the endoscope. °· You will be asked to lie on your left side. °· The endoscope will be inserted down your throat and into your esophagus, stomach, and duodenum. °· Air will be put through the endoscope to allow your health care provider to clearly view the lining of your esophagus. °· The lining of your esophagus, stomach, and duodenum will be examined. During the exam, your health care provider may: °¨ Remove tissue to be examined under a microscope (biopsy) for inflammation, infection, or other medical problems. °¨ Remove growths. °¨ Remove objects (foreign bodies) that are stuck. °¨ Treat any bleeding with medicines or other devices that stop tissues from bleeding (hot cautery, clipping devices). °¨ Widen (dilate) or stretch narrowed areas of your esophagus and stomach. °· The endoscope will be withdrawn. °AFTER THE PROCEDURE °· You will be taken to a recovery area for observation. Your blood pressure, heart rate, breathing rate, and blood oxygen level will be monitored often until the medicines you were given have worn off. °· Do not eat or drink anything until the numbing medicine has worn off and your gag reflex has returned. You may choke. °· Your health care provider should be able to discuss his or her findings with you. It will take longer to discuss the test results if any biopsies were taken. °  °  This information is not intended to replace advice given to you by your health care provider. Make sure you discuss any questions you have with your health care provider. °  °Document Released: 06/20/2004 Document Revised: 03/10/2014 Document Reviewed: 01/21/2012 °Elsevier Interactive Patient Education ©2016 [employer] ° °

## 2015-06-19 NOTE — Anesthesia Preprocedure Evaluation (Signed)
Anesthesia Evaluation  Patient identified by MRN, date of birth, ID band Patient awake    Reviewed: Allergy & Precautions, NPO status , Patient's Chart, lab work & pertinent test results  Airway Mallampati: II  TM Distance: >3 FB Neck ROM: Full    Dental no notable dental hx.    Pulmonary neg pulmonary ROS,    Pulmonary exam normal breath sounds clear to auscultation       Cardiovascular hypertension, Pt. on medications Normal cardiovascular exam Rhythm:Regular Rate:Normal     Neuro/Psych negative neurological ROS  negative psych ROS   GI/Hepatic Neg liver ROS, GERD  Medicated,  Endo/Other  negative endocrine ROS  Renal/GU negative Renal ROS  negative genitourinary   Musculoskeletal negative musculoskeletal ROS (+)   Abdominal   Peds negative pediatric ROS (+)  Hematology negative hematology ROS (+)   Anesthesia Other Findings   Reproductive/Obstetrics negative OB ROS                             Anesthesia Physical Anesthesia Plan  ASA: II  Anesthesia Plan: MAC   Post-op Pain Management:    Induction: Intravenous  Airway Management Planned: Natural Airway  Additional Equipment:   Intra-op Plan:   Post-operative Plan:   Informed Consent: I have reviewed the patients History and Physical, chart, labs and discussed the procedure including the risks, benefits and alternatives for the proposed anesthesia with the patient or authorized representative who has indicated his/her understanding and acceptance.   Dental advisory given  Plan Discussed with: CRNA  Anesthesia Plan Comments:         Anesthesia Quick Evaluation

## 2015-06-19 NOTE — Anesthesia Postprocedure Evaluation (Signed)
Anesthesia Post Note  Patient: Lee Gilmore  Procedure(s) Performed: Procedure(s) (LRB): ESOPHAGOGASTRODUODENOSCOPY (EGD) WITH PROPOFOL (N/A)  Patient location during evaluation: PACU Anesthesia Type: MAC Level of consciousness: awake and alert Pain management: pain level controlled Vital Signs Assessment: post-procedure vital signs reviewed and stable Respiratory status: spontaneous breathing, nonlabored ventilation, respiratory function stable and patient connected to nasal cannula oxygen Cardiovascular status: stable and blood pressure returned to baseline Anesthetic complications: no    Last Vitals:  Filed Vitals:   06/19/15 0800 06/19/15 0810  BP: 137/93 141/94  Pulse: 56 69  Temp:    Resp: 15 17    Last Pain: There were no vitals filed for this visit.               Caran Storck J

## 2015-06-19 NOTE — Transfer of Care (Signed)
Immediate Anesthesia Transfer of Care Note  Patient: Lee Gilmore  Procedure(s) Performed: Procedure(s): ESOPHAGOGASTRODUODENOSCOPY (EGD) WITH PROPOFOL (N/A)  Patient Location: PACU  Anesthesia Type:MAC  Level of Consciousness:  sedated, patient cooperative and responds to stimulation  Airway & Oxygen Therapy:Patient Spontanous Breathing and Patient connected to face mask oxgen  Post-op Assessment:  Report given to PACU RN and Post -op Vital signs reviewed and stable  Post vital signs:  Reviewed and stable  Last Vitals:  Filed Vitals:   06/19/15 0647 06/19/15 0748  BP: 156/106 124/66  Pulse: 64 65  Temp: 36.6 C   Resp: 19 13    Complications: No apparent anesthesia complications

## 2015-06-19 NOTE — H&P (Signed)
  Problem: Heartburn. 04/23/2015 barium upper GI x-ray series showed a moderate sized hiatal hernia. 07/22/2012 colonoscopy was performed with removal of a 6 mm adenomatous sigmoid colon polyp. Mother diagnosed with colon cancer at age 46.  History: The patient is a 45 year old male born 09/20/1970. He was prescribed a proton pump inhibitor when he developed heartburn. His barium upper GI x-ray series showed a moderate sized hiatal hernia with spontaneous gastroesophageal reflux.  The patient is currently taking Nexium each morning with reasonable control of his heartburn. He reports no dysphagia or odynophagia.  He is scheduled to undergo diagnostic esophagogastroduodenoscopy with screen for Barrett's esophagus.  Past medical history: Hypertension. Allergic rhinitis. Hypercholesterolemia. Bilateral inguinal hernia repairs.  Family history: Mother was diagnosed with colon cancer at age 46.  Medication allergies: None  Exam: The patient is alert and lying comfortably on the endoscopy stretcher. Abdomen is soft and nontender to palpation. Lungs are clear to auscultation. Cardiac exam reveals a regular rhythm.  Plan: Proceed with diagnostic esophagogastroduodenoscopy.

## 2015-06-20 ENCOUNTER — Encounter (HOSPITAL_COMMUNITY): Payer: Self-pay | Admitting: Gastroenterology

## 2015-09-05 ENCOUNTER — Other Ambulatory Visit: Payer: Self-pay | Admitting: Internal Medicine

## 2015-09-05 MED ORDER — VALSARTAN-HYDROCHLOROTHIAZIDE 160-12.5 MG PO TABS: 1.0000 | ORAL_TABLET | Freq: Every day | ORAL | Status: AC

## 2015-10-24 ENCOUNTER — Encounter: Payer: Self-pay | Admitting: Podiatry

## 2015-10-24 ENCOUNTER — Ambulatory Visit (INDEPENDENT_AMBULATORY_CARE_PROVIDER_SITE_OTHER): Payer: BLUE CROSS/BLUE SHIELD | Admitting: Podiatry

## 2015-10-24 DIAGNOSIS — M722 Plantar fascial fibromatosis: Secondary | ICD-10-CM | POA: Diagnosis not present

## 2015-10-24 NOTE — Progress Notes (Signed)
He presents today for follow-up of bilateral plantar fasciitis he states that he was doing great and then over the past few weeks it has started to recur.  Objective: Vital signs are stable alert and 3. Pulses are palpable bilateral. He has pain on palpation medial trochanter goals bilateral.  Assessment: Plantar fasciitis bilateral.  Plan: Injected bilateral heels today with Kenalog and local anesthetic continue all other conservative therapies including diclofenac gel follow up with me in 1 month

## 2015-11-21 ENCOUNTER — Ambulatory Visit: Payer: BLUE CROSS/BLUE SHIELD | Admitting: Podiatry

## 2015-11-23 ENCOUNTER — Telehealth: Payer: Self-pay | Admitting: *Deleted

## 2015-11-23 NOTE — Telephone Encounter (Signed)
Pt left name, DOB and phone number.  I informed pt that I had to triage messages by urgency, and would need more than just contact information. Pt states understanding and said he is a friend of Dr. Al CorpusHyatt and wanted to get in touch with him because he knew he would be out of town the next week or so.  I started to tell him that I could not give him Dr. Geryl RankinsHyatt's contact information and the phone line went dead. I attempted to speak with the pt 2 more times on the same phone line but there was only voicemail and it was full.

## 2016-02-19 ENCOUNTER — Ambulatory Visit (INDEPENDENT_AMBULATORY_CARE_PROVIDER_SITE_OTHER): Payer: BLUE CROSS/BLUE SHIELD | Admitting: Podiatry

## 2016-02-19 ENCOUNTER — Encounter: Payer: Self-pay | Admitting: Podiatry

## 2016-02-19 DIAGNOSIS — M722 Plantar fascial fibromatosis: Secondary | ICD-10-CM | POA: Diagnosis not present

## 2016-02-19 NOTE — Progress Notes (Signed)
He presents today for follow-up of his plantar fasciitis. 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  Objective: Vital signs are stable alert and oriented 3. Pulses are palpable. Neurologic sensorium is intact. He has pain on palpation medial calcaneal tubercles bilateral. He does not wear his regular orthotics.  Assessment: Chronic intractable plantar fasciitis bilateral.  Plan: He requested another injection today rather than physical therapy or surgery this time. I reinjected him Kenalog and local anesthetic.

## 2016-04-03 IMAGING — RF DG UGI W/ HIGH DENSITY W/KUB
19 of 24 series · 19 of 24 positions shown · non-contrast
Comparison: Report from 12/11/2000 exam.

CLINICAL DATA: 44-year-old male with history of gastroesophageal
reflux presenting with abdominal discomfort. Initial encounter.

EXAM:
UPPER GI SERIES WITH KUB
TECHNIQUE: After obtaining a scout radiograph a routine upper GI series was
performed using thin and high density barium.
FLUOROSCOPY TIME:  Radiation Exposure Index (as provided by the
fluoroscopic device): 109 d Gray/cm2
Fluoroscopy Time 1 minutes and 30 seconds.

[Series 1: run · 1 of 1 slices shown (1 of 19)]
[im 1/1]
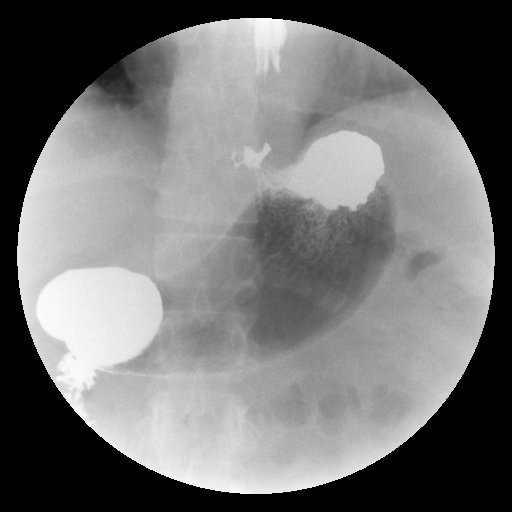

[Series 2: run · 1 of 1 slices shown (2 of 19)]
[im 1/1]
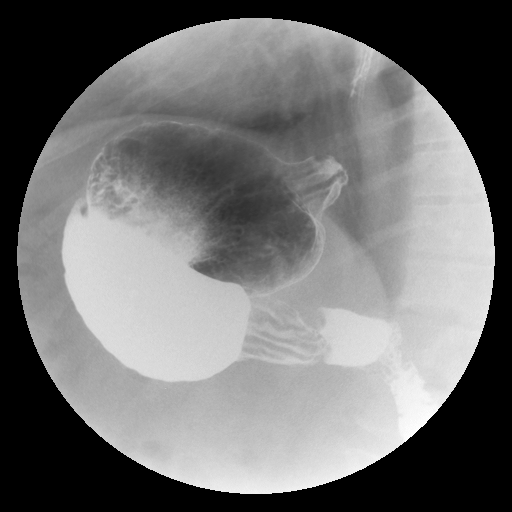

[Series 4: run · 1 of 1 slices shown (3 of 19)]
[im 1/1]
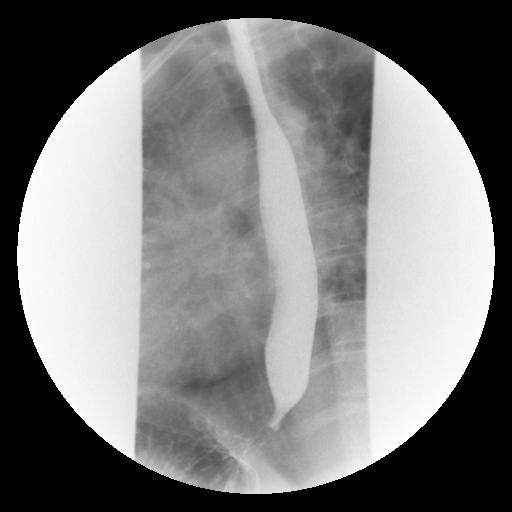

[Series 5: run · 1 of 1 slices shown (4 of 19)]
[im 1/1]
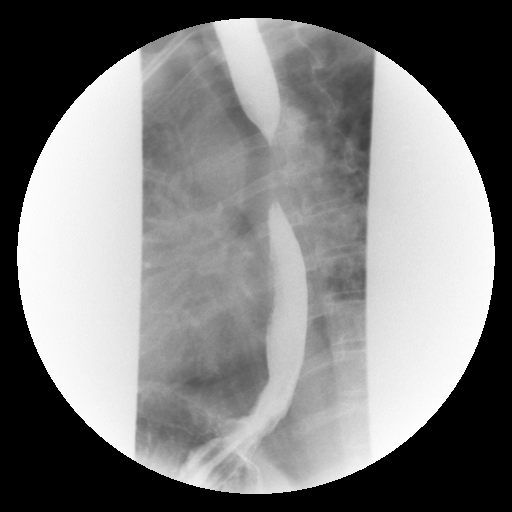

[Series 6: run · 1 of 1 slices shown (5 of 19)]
[im 1/1]
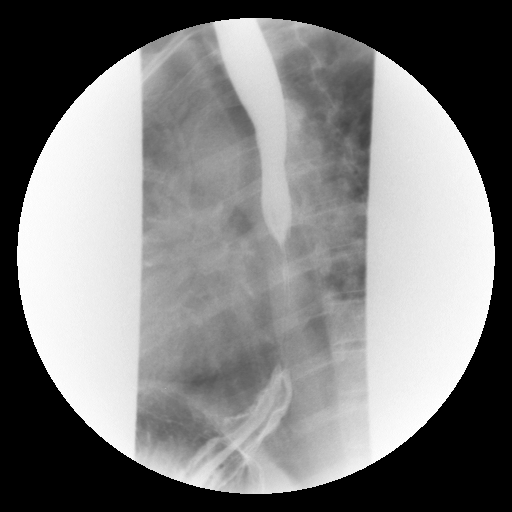

[Series 7: run · 1 of 1 slices shown (6 of 19)]
[im 1/1]
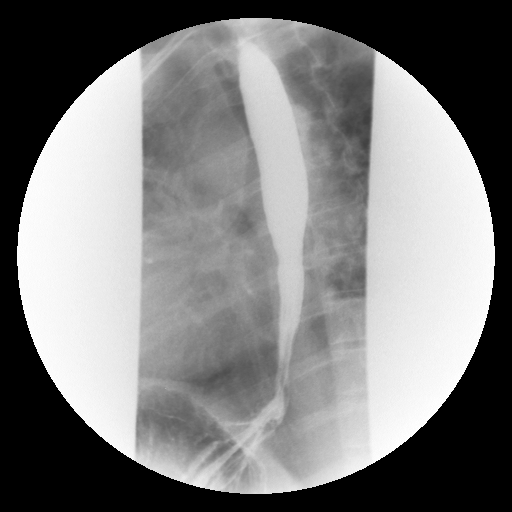

[Series 9: run · 1 of 1 slices shown (7 of 19)]
[im 1/1]
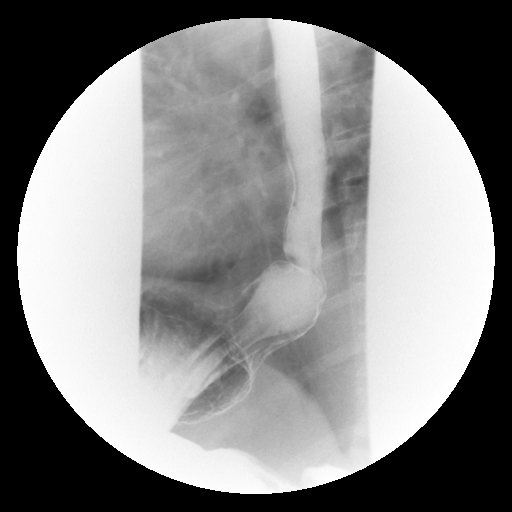

[Series 10: run · 1 of 1 slices shown (8 of 19)]
[im 1/1]
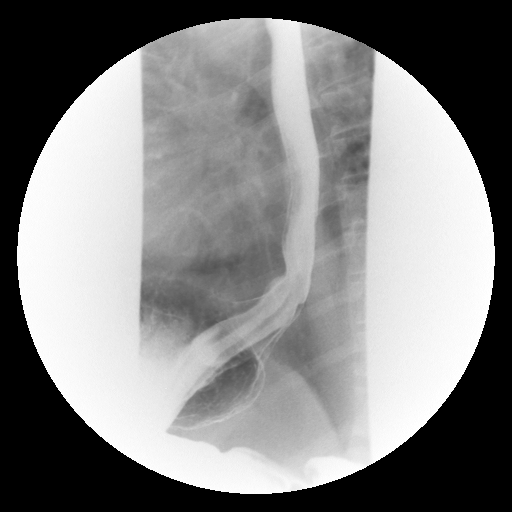

[Series 11: run · 1 of 1 slices shown (9 of 19)]
[im 1/1]
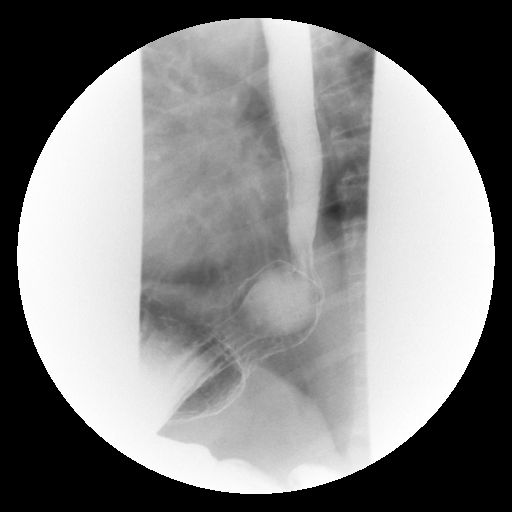

[Series 13: run · 1 of 1 slices shown (10 of 19)]
[im 1/1]
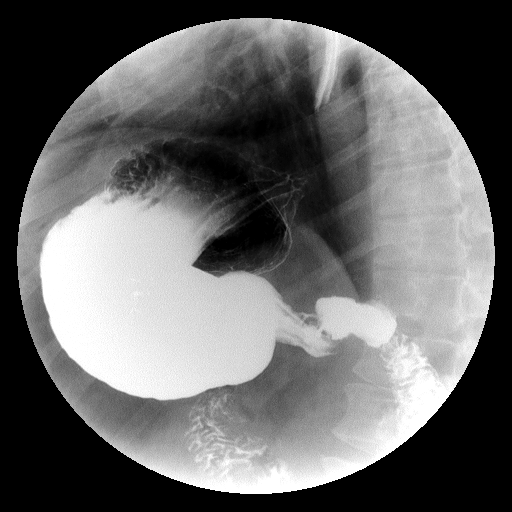

[Series 14: run · 1 of 1 slices shown (11 of 19)]
[im 1/1]
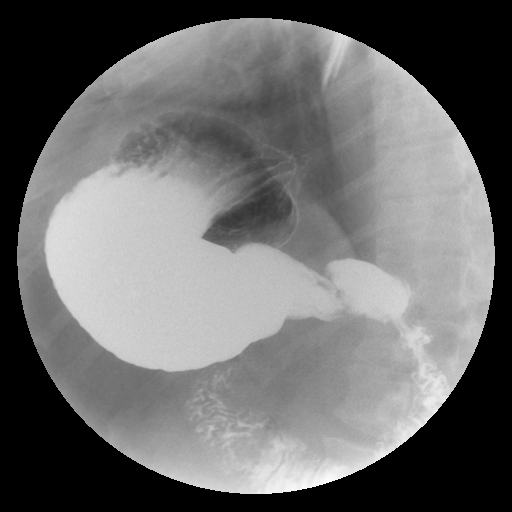

[Series 15: run · 1 of 1 slices shown (12 of 19)]
[im 1/1]
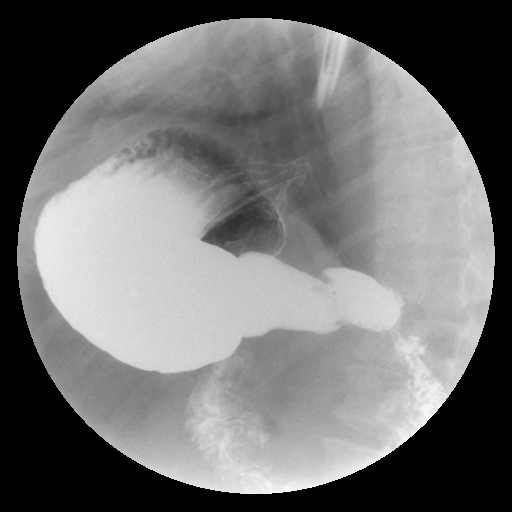

[Series 16: run · 1 of 1 slices shown (13 of 19)]
[im 1/1]
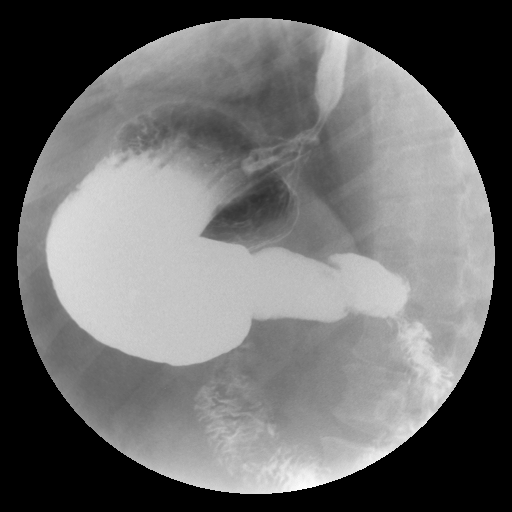

[Series 18: run · 1 of 1 slices shown (14 of 19)]
[im 1/1]
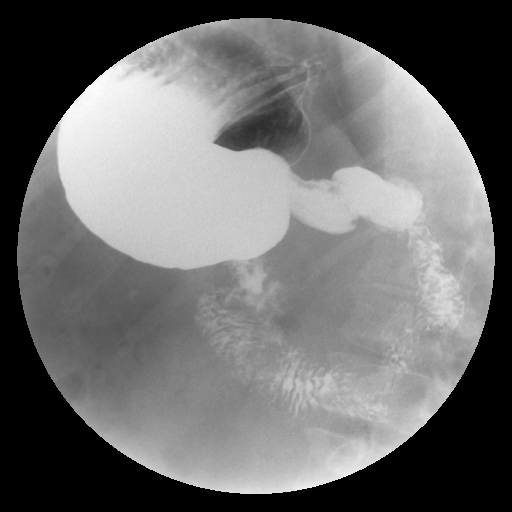

[Series 19: run · 1 of 1 slices shown (15 of 19)]
[im 1/1]
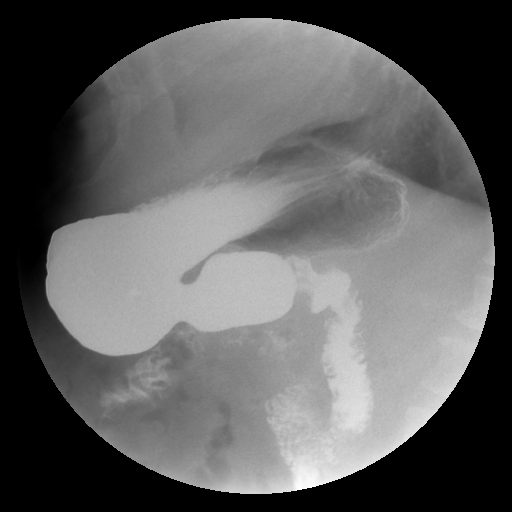

[Series 20: run · 1 of 1 slices shown (16 of 19)]
[im 1/1]
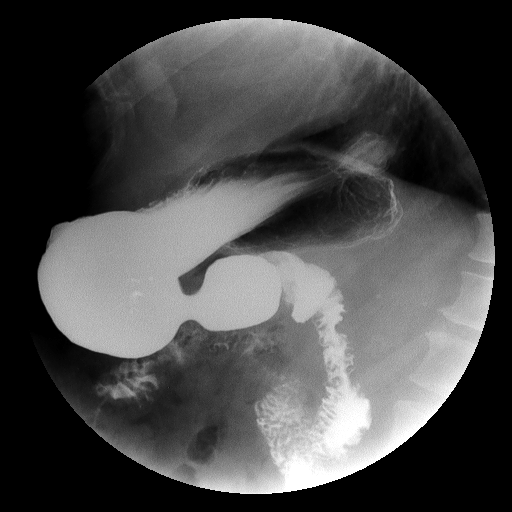

[Series 21: run · 1 of 1 slices shown (17 of 19)]
[im 1/1]
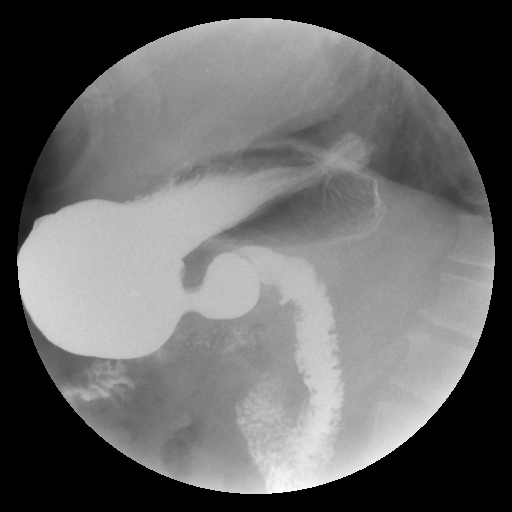

[Series 23: run · 1 of 1 slices shown (18 of 19)]
[im 1/1]
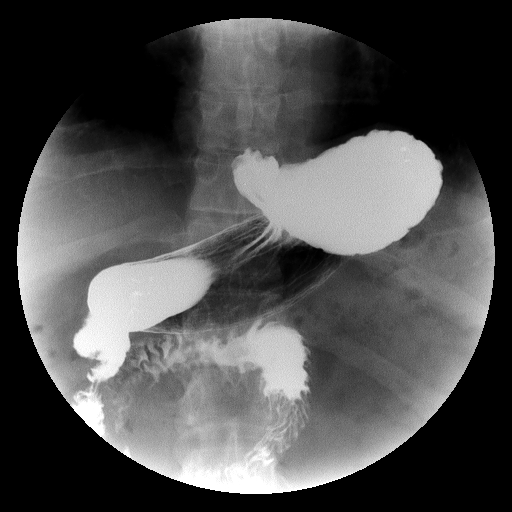

[Series 24: run · 1 of 1 slices shown (19 of 19)]
[im 1/1]
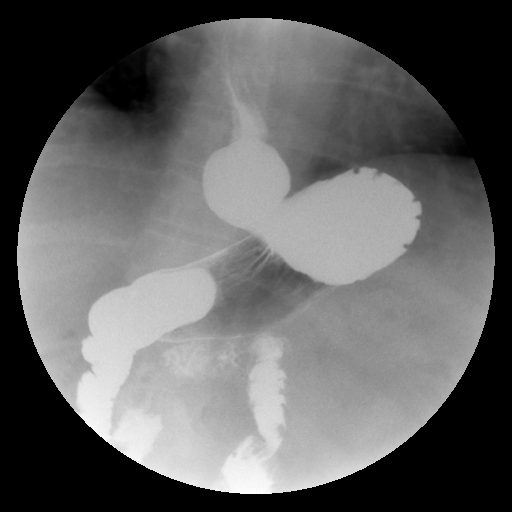

[19 of 24 positions shown; findings below may reference images not displayed]

FINDINGS: Scout view unremarkable.

Normal primary esophageal stripping wave.

Moderate-size hiatal hernia.

Prominent spontaneous esophageal reflux to level of the upper
thoracic esophagus. No mucosal abnormality noted however, given the
patient's degree of reflux, endoscopic surveillance may be
considered to exclude Barrett's esophagus.

Gastric contour within normal limits without ulceration identified.

No duodenal bulb ulceration.

Scattered duodenal/jejunal diverticula.
IMPRESSION: Moderate-size hiatal hernia.

Prominent spontaneous esophageal reflux to level of the upper
thoracic esophagus. No mucosal abnormality noted however, given the
patient's degree of reflux, endoscopic surveillance may be
considered to exclude Barrett's esophagus.

Gastric contour within normal limits without ulceration identified.

No duodenal bulb ulceration.
